# Patient Record
Sex: Male | Born: 1990 | Race: Black or African American | Hispanic: No | Marital: Single | Smoking: Never smoker
Health system: Southern US, Community
[De-identification: ages and names within clinical notes are randomized; demographics above are authoritative.]

## PROBLEM LIST (undated history)

## (undated) DIAGNOSIS — K603 Anal fistula: Secondary | ICD-10-CM

## (undated) HISTORY — PX: ARTHROSCOPIC REPAIR ACL: SUR80

---
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
## 2012-03-03 DIAGNOSIS — K603 Anal fistula: Secondary | ICD-10-CM

## 2012-03-03 HISTORY — DX: Anal fistula: K60.3

## 2012-03-10 ENCOUNTER — Ambulatory Visit (INDEPENDENT_AMBULATORY_CARE_PROVIDER_SITE_OTHER): Payer: 59 | Admitting: General Surgery

## 2012-03-10 ENCOUNTER — Encounter (INDEPENDENT_AMBULATORY_CARE_PROVIDER_SITE_OTHER): Payer: Self-pay | Admitting: General Surgery

## 2012-03-10 VITALS — BP 124/80 | HR 68 | Temp 97.2°F | Resp 16 | Ht 67.0 in | Wt 149.0 lb

## 2012-03-10 DIAGNOSIS — K603 Anal fistula: Secondary | ICD-10-CM

## 2012-03-10 NOTE — Progress Notes (Signed)
Patient ID: COBI DELPH, male   DOB: 1990/12/31, 21 y.o.   MRN: [id number]  No chief complaint on file.   HPI Brandon Hutchinson is a 21 y.o. male.  This patient is referred by Dr. Larwance Sachs for evaluation of a chronic perirectal abscess.  He has had 2 prior incision and drainage in the area of concern with the most recent time about 3 or 4 months ago. He says that he has also drained this cyst his anus twice by himself and has been treated with antibiotics as well. He says it doesn't cause any discomfort but he has daily drainage of bloody and purulent drainage. He says his bowels are otherwise normal and denies any diarrhea constipation or any family history of inflammatory bowel disease. Denies any fevers or chills. HPI  Past Medical History  Diagnosis Date  . Anorectal abscess   . Acne   . Pain in joint, lower leg   . Hemorrhage of rectum and anus     Past Surgical History  Procedure Date  . Arthroscopic repair acl 2009, 2011    Right Knee    No family history on file.  Social History History  Substance Use Topics  . Smoking status: Never Smoker   . Smokeless tobacco: Not on file  . Alcohol Use: No    Allergies no known allergies  No current outpatient prescriptions on file.    Review of Systems Review of Systems All other review of systems negative or noncontributory except as stated in the HPI  Blood pressure 124/80, pulse 68, temperature 97.2 F (36.2 C), temperature source Temporal, resp. rate 16, height 5\' 7"  (1.702 m), weight 149 lb (67.586 kg).  Physical Exam Physical Exam Physical Exam  Vitals reviewed. Constitutional: He is oriented to person, place, and time. He appears well-developed and well-nourished. No distress.  HENT:  Head: Normocephalic and atraumatic.  Mouth/Throat: No oropharyngeal exudate.  Eyes: Conjunctivae and EOM are normal. Pupils are equal, round, and reactive to light. Right eye exhibits no discharge. Left eye exhibits no discharge. No  scleral icterus.  Neck: Normal range of motion. No tracheal deviation present.  Cardiovascular: Normal rate, regular rhythm and normal heart sounds.   Pulmonary/Chest: Effort normal and breath sounds normal. No stridor. No respiratory distress. He has no wheezes. He has no rales. He exhibits no tenderness.  Abdominal: Soft. Bowel sounds are normal. He exhibits no distension and no mass. There is no tenderness. There is no rebound and no guarding.  Musculoskeletal: Normal range of motion. He exhibits no edema and no tenderness.  Neurological: He is alert and oriented to person, place, and time.  Skin: Skin is warm and dry. No rash noted. He is not diaphoretic. No erythema. No pallor.  Psychiatric: He has a normal mood and affect. His behavior is normal. Judgment and thought content normal.  Rectal: Rectal exam is negative for tenderness or internal fluctuance or drainage. There is no blood or masses. In the natal cleft he has some scarring in area of chronic drainage and an internal fistula opening and approximately 4 cm lateral to this on the buttocks he also has some granulation tissue in the area of drainage. He has some induration in the area consistent with chronic abscess. I was able to probe this external fistula openings for about 4 cm and this appeared to track towards the midline and did not appear to tract towards the anus.  Data Reviewed   Assessment    Chronic abscess with  fistula I think that this is most likely a chronic abscess in the buttock area could be from pilonidal disease as well although he does not have much disease in the midline itself. I think that she is likely developed a fistula from his prior incision and drainage sites but this fortunately does not appear to be involving the anus or the sphincter muscles. I would recommend rectal exam under anesthesia with likely fistulotomy and drainage of chronic abscess. I discussed with him the procedure and the risks including  infection, bleeding,, pain, scarring, recurrent fistula, need for chronic wound care, need for seton placement and he expressed understanding and would like to proceed with surgical intervention. However, he is getting ready to return to college and so scheduling may be an issue. We will try to set him up for rectal exam and anesthesia with possible fistulotomy as soon as possible    Plan    Plan for rectal exam under anesthesia with likely fistulotomy       Brandon Hutchinson DAVID 03/10/2012, 5:03 PM

## 2012-03-24 ENCOUNTER — Encounter (HOSPITAL_BASED_OUTPATIENT_CLINIC_OR_DEPARTMENT_OTHER): Payer: Self-pay | Admitting: *Deleted

## 2012-03-31 ENCOUNTER — Encounter (HOSPITAL_BASED_OUTPATIENT_CLINIC_OR_DEPARTMENT_OTHER): Payer: Self-pay | Admitting: *Deleted

## 2012-03-31 ENCOUNTER — Encounter (HOSPITAL_BASED_OUTPATIENT_CLINIC_OR_DEPARTMENT_OTHER): Payer: Self-pay | Admitting: Anesthesiology

## 2012-03-31 ENCOUNTER — Ambulatory Visit (HOSPITAL_BASED_OUTPATIENT_CLINIC_OR_DEPARTMENT_OTHER)
Admission: RE | Admit: 2012-03-31 | Discharge: 2012-03-31 | Disposition: A | Discharge status: Home / Self Care | Payer: 59 | Source: Ambulatory Visit | Attending: General Surgery | Admitting: General Surgery

## 2012-03-31 ENCOUNTER — Ambulatory Visit (HOSPITAL_BASED_OUTPATIENT_CLINIC_OR_DEPARTMENT_OTHER): Payer: 59 | Admitting: Anesthesiology

## 2012-03-31 ENCOUNTER — Encounter (HOSPITAL_BASED_OUTPATIENT_CLINIC_OR_DEPARTMENT_OTHER)
Admission: RE | Disposition: A | Discharge status: Home / Self Care | Payer: Self-pay | Source: Ambulatory Visit | Attending: General Surgery

## 2012-03-31 DIAGNOSIS — K603 Anal fistula, unspecified: Secondary | ICD-10-CM | POA: Insufficient documentation

## 2012-03-31 DIAGNOSIS — L0231 Cutaneous abscess of buttock: Secondary | ICD-10-CM | POA: Insufficient documentation

## 2012-03-31 DIAGNOSIS — K612 Anorectal abscess: Secondary | ICD-10-CM

## 2012-03-31 DIAGNOSIS — L03317 Cellulitis of buttock: Secondary | ICD-10-CM | POA: Insufficient documentation

## 2012-03-31 HISTORY — PX: ANAL FISTULECTOMY: SHX1139

## 2012-03-31 HISTORY — DX: Anal fistula: K60.3

## 2012-03-31 HISTORY — PX: EXAMINATION UNDER ANESTHESIA: SHX1540

## 2012-03-31 LAB — POCT HEMOGLOBIN-HEMACUE: Hemoglobin: 14.6 g/dL (ref 13.0–17.0)

## 2012-03-31 SURGERY — EXAM UNDER ANESTHESIA
Anesthesia: General | Site: Rectum | Wound class: Dirty or Infected

## 2012-03-31 MED ORDER — METOCLOPRAMIDE HCL 5 MG/ML IJ SOLN
INTRAMUSCULAR | Status: DC | PRN
  Administered 2012-03-31: 10 mg via INTRAVENOUS

## 2012-03-31 MED ORDER — ONDANSETRON HCL 4 MG/2ML IJ SOLN
INTRAMUSCULAR | Status: DC | PRN
  Administered 2012-03-31: 4 mg via INTRAVENOUS

## 2012-03-31 MED ORDER — CIPROFLOXACIN IN D5W 400 MG/200ML IV SOLN
400.0000 mg | INTRAVENOUS | Status: AC
  Administered 2012-03-31 (×2): 400 mg via INTRAVENOUS

## 2012-03-31 MED ORDER — HYDROMORPHONE HCL PF 1 MG/ML IJ SOLN: 0.2500 mg | INTRAMUSCULAR | Status: DC | PRN

## 2012-03-31 MED ORDER — LACTATED RINGERS IV SOLN
INTRAVENOUS | Status: DC
Start: 2012-03-31 — End: 2012-03-31
  Administered 2012-03-31 (×2): via INTRAVENOUS

## 2012-03-31 MED ORDER — LIDOCAINE HCL (CARDIAC) 20 MG/ML IV SOLN
INTRAVENOUS | Status: DC | PRN
  Administered 2012-03-31: 50 mg via INTRAVENOUS

## 2012-03-31 MED ORDER — PROPOFOL 10 MG/ML IV BOLUS
INTRAVENOUS | Status: DC | PRN
  Administered 2012-03-31: 200 mg via INTRAVENOUS

## 2012-03-31 MED ORDER — DEXAMETHASONE SODIUM PHOSPHATE 4 MG/ML IJ SOLN
INTRAMUSCULAR | Status: DC | PRN
  Administered 2012-03-31: 10 mg via INTRAVENOUS

## 2012-03-31 MED ORDER — OXYCODONE HCL 5 MG PO TABS: 5.0000 mg | ORAL_TABLET | Freq: Once | ORAL | Status: DC | PRN

## 2012-03-31 MED ORDER — SUCCINYLCHOLINE CHLORIDE 20 MG/ML IJ SOLN
INTRAMUSCULAR | Status: DC | PRN
  Administered 2012-03-31: 100 mg via INTRAVENOUS

## 2012-03-31 MED ORDER — FENTANYL CITRATE 0.05 MG/ML IJ SOLN
INTRAMUSCULAR | Status: DC | PRN
  Administered 2012-03-31: 25 ug via INTRAVENOUS
  Administered 2012-03-31: 100 ug via INTRAVENOUS

## 2012-03-31 MED ORDER — BUPIVACAINE LIPOSOME 1.3 % IJ SUSP
INTRAMUSCULAR | Status: DC | PRN
  Administered 2012-03-31: 20 mL

## 2012-03-31 MED ORDER — OXYCODONE HCL 5 MG/5ML PO SOLN: 5.0000 mg | Freq: Once | ORAL | Status: DC | PRN

## 2012-03-31 MED ORDER — HYDROCODONE-ACETAMINOPHEN 5-325 MG PO TABS: 1.0000 | ORAL_TABLET | ORAL | Status: AC | PRN

## 2012-03-31 MED ORDER — MIDAZOLAM HCL 5 MG/5ML IJ SOLN
INTRAMUSCULAR | Status: DC | PRN
  Administered 2012-03-31: 2 mg via INTRAVENOUS

## 2012-03-31 SURGICAL SUPPLY — 49 items
BENZOIN TINCTURE PRP APPL 2/3 (GAUZE/BANDAGES/DRESSINGS) IMPLANT
BLADE SURG 11 STRL SS (BLADE) IMPLANT
BLADE SURG 15 STRL LF DISP TIS (BLADE) ×2 IMPLANT
BLADE SURG 15 STRL SS (BLADE) ×1
BRIEF STRETCH FOR OB PAD LRG (UNDERPADS AND DIAPERS) IMPLANT
CANISTER SUCTION 1200CC (MISCELLANEOUS) ×3 IMPLANT
COVER MAYO STAND STRL (DRAPES) ×3 IMPLANT
COVER TABLE BACK 60X90 (DRAPES) ×3 IMPLANT
DECANTER SPIKE VIAL GLASS SM (MISCELLANEOUS) IMPLANT
DRAPE PED LAPAROTOMY (DRAPES) ×3 IMPLANT
DRAPE UTILITY XL STRL (DRAPES) ×3 IMPLANT
DRSG PAD ABDOMINAL 8X10 ST (GAUZE/BANDAGES/DRESSINGS) IMPLANT
ELECT COATED BLADE 2.86 ST (ELECTRODE) ×3 IMPLANT
ELECT REM PT RETURN 9FT ADLT (ELECTROSURGICAL) ×3
ELECTRODE REM PT RTRN 9FT ADLT (ELECTROSURGICAL) ×2 IMPLANT
GAUZE SPONGE 4X4 12PLY STRL LF (GAUZE/BANDAGES/DRESSINGS) ×3 IMPLANT
GAUZE VASELINE 1X8 (GAUZE/BANDAGES/DRESSINGS) IMPLANT
GLOVE BIO SURGEON STRL SZ7.5 (GLOVE) ×3 IMPLANT
GLOVE ECLIPSE 6.5 STRL STRAW (GLOVE) ×3 IMPLANT
GLOVE SKINSENSE NS SZ7.0 (GLOVE) ×1
GLOVE SKINSENSE STRL SZ7.0 (GLOVE) ×2 IMPLANT
GLOVE SURG SS PI 7.5 STRL IVOR (GLOVE) ×6 IMPLANT
GLOVE SURG SS PI 8.5 STRL IVOR (GLOVE)
GLOVE SURG SS PI 8.5 STRL STRW (GLOVE) IMPLANT
GOWN PREVENTION PLUS XLARGE (GOWN DISPOSABLE) ×6 IMPLANT
GOWN PREVENTION PLUS XXLARGE (GOWN DISPOSABLE) ×3 IMPLANT
NDL SAFETY ECLIPSE 18X1.5 (NEEDLE) ×2 IMPLANT
NEEDLE HYPO 18GX1.5 SHARP (NEEDLE) ×1
NEEDLE HYPO 22GX1.5 SAFETY (NEEDLE) ×3 IMPLANT
NEEDLE HYPO 25X1 1.5 SAFETY (NEEDLE) ×3 IMPLANT
PACK BASIN DAY SURGERY FS (CUSTOM PROCEDURE TRAY) ×3 IMPLANT
PENCIL BUTTON HOLSTER BLD 10FT (ELECTRODE) ×3 IMPLANT
SHEARS HARMONIC 9CM CVD (BLADE) IMPLANT
SLEEVE SCD COMPRESS KNEE MED (MISCELLANEOUS) ×3 IMPLANT
SPONGE GAUZE 4X4 12PLY (GAUZE/BANDAGES/DRESSINGS) ×3 IMPLANT
SPONGE SURGIFOAM ABS GEL 100 (HEMOSTASIS) IMPLANT
SURGILUBE 2OZ TUBE FLIPTOP (MISCELLANEOUS) ×3 IMPLANT
SUT CHROMIC 3 0 SH 27 (SUTURE) IMPLANT
SYR 20CC LL (SYRINGE) ×3 IMPLANT
SYR CONTROL 10ML LL (SYRINGE) ×3 IMPLANT
TAPE CLOTH 3X10 TAN LF (GAUZE/BANDAGES/DRESSINGS) ×3 IMPLANT
TAPE CLOTH SURG 4X10 WHT LF (GAUZE/BANDAGES/DRESSINGS) ×3 IMPLANT
TOWEL OR 17X24 6PK STRL BLUE (TOWEL DISPOSABLE) ×3 IMPLANT
TOWEL OR NON WOVEN STRL DISP B (DISPOSABLE) ×3 IMPLANT
TRAY DSU PREP LF (CUSTOM PROCEDURE TRAY) ×3 IMPLANT
TUBE CONNECTING 20X1/4 (TUBING) ×3 IMPLANT
UNDERPAD 30X30 INCONTINENT (UNDERPADS AND DIAPERS) ×3 IMPLANT
WATER STERILE IRR 1000ML POUR (IV SOLUTION) ×3 IMPLANT
YANKAUER SUCT BULB TIP NO VENT (SUCTIONS) ×3 IMPLANT

## 2012-03-31 NOTE — Anesthesia Postprocedure Evaluation (Signed)
Anesthesia Post Note  Patient: Brandon Hutchinson  Procedure(s) Performed: Procedure(s) (LRB): EXAM UNDER ANESTHESIA (N/A) FISTULECTOMY ANAL (N/A)  Anesthesia type: General  Patient location: PACU  Post pain: Pain level controlled  Post assessment: Patient's Cardiovascular Status Stable  Last Vitals:  Filed Vitals:   03/31/12 1645  BP: 131/83  Pulse: 71  Temp:   Resp: 17    Post vital signs: Reviewed and stable  Level of consciousness: alert  Complications: No apparent anesthesia complications

## 2012-03-31 NOTE — Op Note (Signed)
Brandon Hutchinson, [place] NO.:  [id number]  MEDICAL RECORD NO.:  [id number]  LOCATION:                                 FACILITY:  PHYSICIAN:  Lodema Pilot, MD            DATE OF BIRTH:  DATE OF PROCEDURE:  03/31/2012 DATE OF DISCHARGE:                              OPERATIVE REPORT   PROCEDURE:  Rectal exam under anesthesia with fistulectomy and drainage of buttock abscess.  PREOPERATIVE DIAGNOSIS:  Anal fistula.  POSTOPERATIVE DIAGNOSIS:  Buttock abscess and fistula.  SURGEON:  Lodema Pilot, MD  ASSISTANT:  None.  ANESTHESIA:  General endotracheal tube anesthesia with 20 mL of Exparel injected locally.  FLUIDS:  1500 mL of crystalloid.  ESTIMATED BLOOD LOSS:  Minimal.  DRAINS:  Wound packing.  SPECIMENS:  None.  COMPLICATIONS:  None apparent.  FINDINGS:  Connection from the lateral wound towards the midline opening with well-formed fistula tract as well as adjacent buttock abscess.  No evidence of internal connection to the rectum or anus.  INDICATION FOR PROCEDURE:  Mr. Antunes was in 21 year old male who has had 2 prior incision and drainage of perirectal or buttock abscesses, and he has had chronic drainage from these openings (I and D), and he has never had complete closure.  OPERATIVE DETAILS:  The patient was seen evaluated in the preoperative area, and risks and benefits of procedure were again discussed in lay terms.  Informed consent was obtained.  He was taken to the operating room, placed on table in the supine position.  General endotracheal anesthesia was obtained.  He was then flipped in the prone jack-knife position with his buttocks taped laterally.  Prophylactic antibiotics were given.  The area was prepped and draped in a standard surgical fashion and rectal exam was performed under anesthesia with the anal retractor, and I did not see any evidence of any internal connection or any internal purulence.  I cut the fistulous opening  laterally and disconnected this tract towards the midline to the other opening in the midline in his natal cleft.  The probe was passed easily from one opening to the other without creating any tract, and this was approximately 5 cm distance between the openings.  I contemplated each individual opening, cauterizing the tract and packing in the tract.  I thought that this would be much higher risk for recurrent and persistent fistula.  Decided to connect the fistulous openings over the fistula probe and after I did this, he had a well-healed obvious epithelialized fistula tract.  I excised the tract and each of the openings down to the soft tissue and healthy appearing fatty tissue.  From the inferior and lateral aspect of the wound, I saw some purulence draining from a small opening and a palpable fluctuant area just inferior to this as well.  I made a small counter incision over the fluctuant area and I encountered some purulence, and an abscess cavity.  This was a fairly small cavity, but as I probed from the cavity, this seem to connect with the other fistulous tract in the area that I saw draining purulence.  I excised this abscess  cavity and the fistulous tract as well making it all one wide, but shallow wound.  I did not see any other openings or purulent drainage, and I cauterized the base of the wound for hemostasis and irrigated the wound with sterile saline solution.  After I was confident, there was no other fistula tracts or any connection to the rectum or anus.  I injected the wound with 20 mL of Exparel anesthetic, and then packed the wound with wet-to-dry 4 x 4 gauze, and sterile dressing was applied.  All sponge, needle, and instrument counts were correct at the end of the case.  The patient tolerated the procedure well without apparent complication.          ______________________________ Lodema Pilot, MD     BL/MEDQ  D:  03/31/2012  T:  03/31/2012  Job:  [id number]

## 2012-03-31 NOTE — Interval H&P Note (Signed)
History and Physical Interval Note:  03/31/2012 3:04 PM  Brandon Hutchinson  has presented today for surgery, with the diagnosis of fistula in ano  The various methods of treatment have been discussed with the patient and family. After consideration of risks, benefits and other options for treatment, the patient has consented to  Procedure(s) (LRB): EXAM UNDER ANESTHESIA (N/A) FISTULECTOMY ANAL (N/A) as a surgical intervention .  The patient's history has been reviewed, patient examined, no change in status, stable for surgery.  I have reviewed the patient's chart and labs.  Questions were answered to the patient's satisfaction.  Patient seen in preop area.  He denies any changes from prior exam.  Will plan for rectal EUA and possible fistulotomy and seton placement.  I discussed with him again the risks of continued infection, pain, recurrence, seton placement and need for repeat operation, injury to sphincter and incontinence.    Lodema Pilot DAVID

## 2012-03-31 NOTE — H&P (View-Only) (Signed)
Patient ID: Brandon Hutchinson, male   DOB: 06/23/1991, 21 y.o.   MRN: [id number]  No chief complaint on file.   HPI Brandon Hutchinson is a 21 y.o. male.  This patient is referred by Dr. Babaoff for evaluation of a chronic perirectal abscess.  He has had 2 prior incision and drainage in the area of concern with the most recent time about 3 or 4 months ago. He says that he has also drained this cyst his anus twice by himself and has been treated with antibiotics as well. He says it doesn't cause any discomfort but he has daily drainage of bloody and purulent drainage. He says his bowels are otherwise normal and denies any diarrhea constipation or any family history of inflammatory bowel disease. Denies any fevers or chills. HPI  Past Medical History  Diagnosis Date  . Anorectal abscess   . Acne   . Pain in joint, lower leg   . Hemorrhage of rectum and anus     Past Surgical History  Procedure Date  . Arthroscopic repair acl 2009, 2011    Right Knee    No family history on file.  Social History History  Substance Use Topics  . Smoking status: Never Smoker   . Smokeless tobacco: Not on file  . Alcohol Use: No    Allergies no known allergies  No current outpatient prescriptions on file.    Review of Systems Review of Systems All other review of systems negative or noncontributory except as stated in the HPI  Blood pressure 124/80, pulse 68, temperature 97.2 F (36.2 C), temperature source Temporal, resp. rate 16, height 5' 7" (1.702 m), weight 149 lb (67.586 kg).  Physical Exam Physical Exam Physical Exam  Vitals reviewed. Constitutional: He is oriented to person, place, and time. He appears well-developed and well-nourished. No distress.  HENT:  Head: Normocephalic and atraumatic.  Mouth/Throat: No oropharyngeal exudate.  Eyes: Conjunctivae and EOM are normal. Pupils are equal, round, and reactive to light. Right eye exhibits no discharge. Left eye exhibits no discharge. No  scleral icterus.  Neck: Normal range of motion. No tracheal deviation present.  Cardiovascular: Normal rate, regular rhythm and normal heart sounds.   Pulmonary/Chest: Effort normal and breath sounds normal. No stridor. No respiratory distress. He has no wheezes. He has no rales. He exhibits no tenderness.  Abdominal: Soft. Bowel sounds are normal. He exhibits no distension and no mass. There is no tenderness. There is no rebound and no guarding.  Musculoskeletal: Normal range of motion. He exhibits no edema and no tenderness.  Neurological: He is alert and oriented to person, place, and time.  Skin: Skin is warm and dry. No rash noted. He is not diaphoretic. No erythema. No pallor.  Psychiatric: He has a normal mood and affect. His behavior is normal. Judgment and thought content normal.  Rectal: Rectal exam is negative for tenderness or internal fluctuance or drainage. There is no blood or masses. In the natal cleft he has some scarring in area of chronic drainage and an internal fistula opening and approximately 4 cm lateral to this on the buttocks he also has some granulation tissue in the area of drainage. He has some induration in the area consistent with chronic abscess. I was able to probe this external fistula openings for about 4 cm and this appeared to track towards the midline and did not appear to tract towards the anus.  Data Reviewed   Assessment    Chronic abscess with   fistula I think that this is most likely a chronic abscess in the buttock area could be from pilonidal disease as well although he does not have much disease in the midline itself. I think that she is likely developed a fistula from his prior incision and drainage sites but this fortunately does not appear to be involving the anus or the sphincter muscles. I would recommend rectal exam under anesthesia with likely fistulotomy and drainage of chronic abscess. I discussed with him the procedure and the risks including  infection, bleeding,, pain, scarring, recurrent fistula, need for chronic wound care, need for seton placement and he expressed understanding and would like to proceed with surgical intervention. However, he is getting ready to return to college and so scheduling may be an issue. We will try to set him up for rectal exam and anesthesia with possible fistulotomy as soon as possible    Plan    Plan for rectal exam under anesthesia with likely fistulotomy       Keishia Ground DAVID 03/10/2012, 5:03 PM    

## 2012-03-31 NOTE — Transfer of Care (Signed)
Immediate Anesthesia Transfer of Care Note  Patient: Brandon Hutchinson  Procedure(s) Performed: Procedure(s) (LRB): EXAM UNDER ANESTHESIA (N/A) FISTULECTOMY ANAL (N/A)  Patient Location: PACU  Anesthesia Type: General  Level of Consciousness: awake, alert  and oriented  Airway & Oxygen Therapy: Patient Spontanous Breathing and Patient connected to face mask oxygen  Post-op Assessment: Report given to PACU RN and Post -op Vital signs reviewed and stable  Post vital signs: Reviewed and stable  Complications: No apparent anesthesia complications

## 2012-03-31 NOTE — Brief Op Note (Signed)
03/31/2012  4:33 PM  PATIENT:  Brandon Hutchinson  21 y.o. male  PRE-OPERATIVE DIAGNOSIS:  fistula in ano  POST-OPERATIVE DIAGNOSIS:  fistula in ano  PROCEDURE:  Procedure(s) (LRB): EXAM UNDER ANESTHESIA (N/A) FISTULECTOMY ANAL (N/A)  SURGEON:  Surgeon(s) and Role:    * Lodema Pilot, DO - Primary  PHYSICIAN ASSISTANT:   ASSISTANTS: none   ANESTHESIA:   general  EBL:  Total I/O In: 1500 [I.V.:1500] Out: -   BLOOD ADMINISTERED:none  DRAINS: wound packing   LOCAL MEDICATIONS USED:  OTHER exparel   SPECIMEN:  No Specimen  DISPOSITION OF SPECIMEN:  N/A  COUNTS:  YES  TOURNIQUET:  * No tourniquets in log *  DICTATION: .Other Dictation: Dictation Number   PLAN OF CARE: Discharge to home after PACU  PATIENT DISPOSITION:  PACU - hemodynamically stable.   Delay start of Pharmacological VTE agent (>24hrs) due to surgical blood loss or risk of bleeding: no

## 2012-03-31 NOTE — Anesthesia Preprocedure Evaluation (Signed)
Anesthesia Evaluation  Patient identified by MRN, date of birth, ID band Patient awake    Reviewed: Allergy & Precautions, H&P , NPO status , Patient's Chart, lab work & pertinent test results  Airway Mallampati: II TM Distance: >3 FB Neck ROM: Full    Dental No notable dental hx. (+) Teeth Intact and Dental Advisory Given   Pulmonary neg pulmonary ROS,  breath sounds clear to auscultation  Pulmonary exam normal       Cardiovascular negative cardio ROS  Rhythm:Regular Rate:Normal     Neuro/Psych negative neurological ROS  negative psych ROS   GI/Hepatic negative GI ROS, Neg liver ROS,   Endo/Other  negative endocrine ROS  Renal/GU negative Renal ROS  negative genitourinary   Musculoskeletal   Abdominal   Peds  Hematology negative hematology ROS (+)   Anesthesia Other Findings   Reproductive/Obstetrics negative OB ROS                           Anesthesia Physical Anesthesia Plan  ASA: I  Anesthesia Plan: General   Post-op Pain Management:    Induction: Intravenous  Airway Management Planned: Oral ETT  Additional Equipment:   Intra-op Plan:   Post-operative Plan: Extubation in OR  Informed Consent: I have reviewed the patients History and Physical, chart, labs and discussed the procedure including the risks, benefits and alternatives for the proposed anesthesia with the patient or authorized representative who has indicated his/her understanding and acceptance.   Dental advisory given  Plan Discussed with: CRNA  Anesthesia Plan Comments:         Anesthesia Quick Evaluation  

## 2012-04-01 ENCOUNTER — Encounter (HOSPITAL_BASED_OUTPATIENT_CLINIC_OR_DEPARTMENT_OTHER): Payer: Self-pay | Admitting: General Surgery

## 2012-04-21 ENCOUNTER — Encounter (INDEPENDENT_AMBULATORY_CARE_PROVIDER_SITE_OTHER): Payer: Self-pay | Admitting: General Surgery

## 2012-04-21 ENCOUNTER — Ambulatory Visit (INDEPENDENT_AMBULATORY_CARE_PROVIDER_SITE_OTHER): Payer: 59 | Admitting: General Surgery

## 2012-04-21 VITALS — BP 112/70 | HR 68 | Temp 98.7°F | Resp 20 | Ht 67.0 in | Wt 144.6 lb

## 2012-04-21 DIAGNOSIS — Z5189 Encounter for other specified aftercare: Secondary | ICD-10-CM

## 2012-04-21 DIAGNOSIS — Z4889 Encounter for other specified surgical aftercare: Secondary | ICD-10-CM

## 2012-04-21 NOTE — Progress Notes (Signed)
Subjective:     Patient ID: Brandon Hutchinson, male   DOB: Apr 18, 1991, 21 y.o.   MRN: [id number]  HPI  F/u 3 weeks status post excision of buttock fistula and drainage of buttock abscess. He says that he is doing very well and has no pain in the area. He is surprised at how well he is healing. He has no complaints. He says that he has been doing dressing changes itself and has not been packing the wound. Review of Systems     Objective:   Physical Exam The wound is half the size as the original and is healing well. He has healthy granulation tissue with some fibrinous exudate. I repacked the wound today with a wet to dry dressing    Assessment:     Status post excision of buttock fistula-doing well He is doing well and the wound is actually healing up very nicely. I recommended that he do wet-to-dry dressing changes with possible and even twice a day if possible. He will followup with me in 3 weeks for repeat wound evaluation.    Plan:     followup in 3 weeks and continued wet-to-dry dressings.

## 2012-05-12 ENCOUNTER — Telehealth (INDEPENDENT_AMBULATORY_CARE_PROVIDER_SITE_OTHER): Payer: Self-pay

## 2012-05-12 NOTE — Telephone Encounter (Signed)
Left message regarding appointment on 05/13/12 @ 9:00 am w/Dr. Biagio Quint, appt has been r/s to 1:00 pm due to office schedule change.

## 2012-05-13 ENCOUNTER — Encounter (INDEPENDENT_AMBULATORY_CARE_PROVIDER_SITE_OTHER): Payer: 59 | Admitting: General Surgery

## 2012-05-26 ENCOUNTER — Encounter (INDEPENDENT_AMBULATORY_CARE_PROVIDER_SITE_OTHER): Payer: Self-pay | Admitting: General Surgery

## 2012-08-22 ENCOUNTER — Ambulatory Visit: Payer: 59 | Attending: Family Medicine | Admitting: Audiology

## 2012-08-22 DIAGNOSIS — Z0110 Encounter for examination of ears and hearing without abnormal findings: Secondary | ICD-10-CM | POA: Insufficient documentation

## 2012-08-22 DIAGNOSIS — F802 Mixed receptive-expressive language disorder: Secondary | ICD-10-CM | POA: Insufficient documentation

## 2012-11-01 ENCOUNTER — Emergency Department (HOSPITAL_COMMUNITY): Payer: 59

## 2012-11-01 ENCOUNTER — Emergency Department (HOSPITAL_COMMUNITY)
Admission: EM | Admit: 2012-11-01 | Discharge: 2012-11-01 | Disposition: A | Discharge status: Home / Self Care | Payer: 59 | Attending: Emergency Medicine | Admitting: Emergency Medicine

## 2012-11-01 DIAGNOSIS — W1789XA Other fall from one level to another, initial encounter: Secondary | ICD-10-CM | POA: Insufficient documentation

## 2012-11-01 DIAGNOSIS — Y9239 Other specified sports and athletic area as the place of occurrence of the external cause: Secondary | ICD-10-CM | POA: Insufficient documentation

## 2012-11-01 DIAGNOSIS — S93402A Sprain of unspecified ligament of left ankle, initial encounter: Secondary | ICD-10-CM

## 2012-11-01 DIAGNOSIS — S93409A Sprain of unspecified ligament of unspecified ankle, initial encounter: Secondary | ICD-10-CM | POA: Insufficient documentation

## 2012-11-01 DIAGNOSIS — Y9344 Activity, trampolining: Secondary | ICD-10-CM | POA: Insufficient documentation

## 2012-11-01 DIAGNOSIS — Y92838 Other recreation area as the place of occurrence of the external cause: Secondary | ICD-10-CM | POA: Insufficient documentation

## 2012-11-01 MED ORDER — IBUPROFEN 600 MG PO TABS: 600.0000 mg | ORAL_TABLET | Freq: Three times a day (TID) | ORAL | Status: DC | PRN

## 2012-11-01 MED ORDER — IBUPROFEN 200 MG PO TABS
600.0000 mg | ORAL_TABLET | Freq: Once | ORAL | Status: AC
  Administered 2012-11-01: 600 mg via ORAL
  Filled 2012-11-01: qty 3

## 2012-11-01 MED ORDER — OXYCODONE-ACETAMINOPHEN 5-325 MG PO TABS
1.0000 | ORAL_TABLET | Freq: Once | ORAL | Status: AC
  Administered 2012-11-01: 1 via ORAL
  Filled 2012-11-01: qty 1

## 2012-11-01 NOTE — ED Provider Notes (Signed)
History     CSN: [id number]  Arrival date & time 11/01/12  0054   First MD Initiated Contact with Patient 11/01/12 [phone]      Chief Complaint  Patient presents with  . Ankle Injury     HPI Patient reports doing back flip on the trampoline injuring his left ankle.  This occurred earlier today.  His pain is mild to moderate in severity.  His pain is worse with palpation and ambulation.  Nothing improves his pain.  He denies weakness numbness or tingling.  No other injury.   Past Medical History  Diagnosis Date  . Fist[place] 03/2012    Past Surgical History  Procedure Laterality Date  . Arthroscopic repair acl  2009, 2011    right knee  . Examination under anesthesia  03/31/2012    Procedure: EXAM UNDER ANESTHESIA;  Surgeon: Lodema Pilot, DO;  Location: [facility];  Service: General;  Laterality: N/A;  . Anal fistulectomy  03/31/2012    Procedure: FISTULECTOMY ANAL;  Surgeon: Lodema Pilot, DO;  Location: [facility];  Service: General;  Laterality: N/A;   fistulectomy    No family history on file.  History  Substance Use Topics  . Smoking status: Never Smoker   . Smokeless tobacco: Never Used  . Alcohol Use: No      Review of Systems  All other systems reviewed and are negative.    Allergies  Review of patient's allergies indicates no known allergies.  Home Medications   Current Outpatient Rx  Name  Route  Sig  Dispense  Refill  . ibuprofen (ADVIL,MOTRIN) 600 MG tablet   Oral   Take 1 tablet (600 mg total) by mouth every 8 (eight) hours as needed for pain.   15 tablet   0     BP 131/54  Pulse 61  Temp(Src) 99.4 F (37.4 C) (Oral)  Resp 18  Wt 155 lb (70.308 kg)  BMI 24.27 kg/m2  SpO2 100%  Physical Exam  Nursing note and vitals reviewed. Constitutional: He is oriented to person, place, and time. He appears well-developed and well-nourished.  HENT:  Head: Normocephalic and atraumatic.  Eyes: EOM are normal.  Neck:  Normal range of motion.  Cardiovascular: Normal rate, regular rhythm, normal heart sounds and intact distal pulses.   Pulmonary/Chest: Effort normal and breath sounds normal. No respiratory distress.  Abdominal: Soft. He exhibits no distension. There is no tenderness.  Musculoskeletal: Normal range of motion.  Full range of motion of left ankle.  Tenderness of his left lateral medial malleolus.  Normal DP and PT pulses his left foot.  Compartments soft.  No tenderness fifth metatarsal and the left.  Full range of motion of left knee.  Neurological: He is alert and oriented to person, place, and time.  Skin: Skin is warm and dry.  Psychiatric: He has a normal mood and affect. Judgment normal.    ED Course  Procedures (including critical care time)  Labs Reviewed - No data to display Dg Ankle Complete Left  11/01/2012  *RADIOLOGY REPORT*  Clinical Data: Lateral ankle pain after twisting injury.  LEFT ANKLE COMPLETE - 3+ VIEW  Comparison: None.  Findings: The left ankle appears intact. No evidence of acute fracture or subluxation.  No focal bone lesions.  Bone matrix and cortex appear intact.  No abnormal radiopaque densities in the soft tissues.  IMPRESSION: No acute bony abnormalities identified.   Original Report Authenticated By: Burman Nieves, M.D.    I  personally reviewed the imaging tests through PACS system I reviewed available ER/hospitalization records through the EMR   1. Ankle sprain, left, initial encounter       MDM  Likely left ankle sprain.  X-ray normal.  Discharge home with our RICE instructions.        Lyanne Co, MD 11/01/12 [phone]

## 2012-11-01 NOTE — ED Notes (Addendum)
Pt states he was at trampoline park and did a backflip and landed hard on his L ankle. States the pain is shooting up from his foot. No obvious deformity noted.

## 2016-02-28 ENCOUNTER — Encounter (HOSPITAL_COMMUNITY): Payer: Self-pay

## 2016-02-28 ENCOUNTER — Emergency Department (HOSPITAL_COMMUNITY): Payer: 59

## 2016-02-28 ENCOUNTER — Emergency Department (HOSPITAL_COMMUNITY)
Admission: EM | Admit: 2016-02-28 | Discharge: 2016-02-29 | Disposition: A | Discharge status: Home / Self Care | Payer: 59 | Attending: Emergency Medicine | Admitting: Emergency Medicine

## 2016-02-28 DIAGNOSIS — Y999 Unspecified external cause status: Secondary | ICD-10-CM | POA: Insufficient documentation

## 2016-02-28 DIAGNOSIS — R42 Dizziness and giddiness: Secondary | ICD-10-CM | POA: Diagnosis not present

## 2016-02-28 DIAGNOSIS — Y939 Activity, unspecified: Secondary | ICD-10-CM | POA: Diagnosis not present

## 2016-02-28 DIAGNOSIS — M791 Myalgia: Secondary | ICD-10-CM | POA: Diagnosis not present

## 2016-02-28 DIAGNOSIS — M7918 Myalgia, other site: Secondary | ICD-10-CM

## 2016-02-28 DIAGNOSIS — Y92410 Unspecified street and highway as the place of occurrence of the external cause: Secondary | ICD-10-CM | POA: Diagnosis not present

## 2016-02-28 DIAGNOSIS — R51 Headache: Secondary | ICD-10-CM | POA: Diagnosis present

## 2016-02-28 MED ORDER — METHOCARBAMOL 500 MG PO TABS
500.0000 mg | ORAL_TABLET | Freq: Once | ORAL | Status: AC
  Administered 2016-02-28: 500 mg via ORAL
  Filled 2016-02-28: qty 1

## 2016-02-28 MED ORDER — IBUPROFEN 800 MG PO TABS
800.0000 mg | ORAL_TABLET | Freq: Once | ORAL | Status: AC
  Administered 2016-02-28: 800 mg via ORAL
  Filled 2016-02-28: qty 1
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
## 2016-02-29 MED ORDER — METHOCARBAMOL 500 MG PO TABS: 500.0000 mg | ORAL_TABLET | Freq: Two times a day (BID) | ORAL | 0 refills | Status: DC

## 2016-02-29 MED ORDER — IBUPROFEN 800 MG PO TABS: 800.0000 mg | ORAL_TABLET | Freq: Three times a day (TID) | ORAL | 0 refills | Status: DC

## 2016-02-29 NOTE — ED Notes (Signed)
Pt ambulatory to radiology with steady gait and without assistance
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
## 2022-06-26 ENCOUNTER — Emergency Department (HOSPITAL_COMMUNITY): Admission: EM | Admit: 2022-06-26 | Discharge: 2022-06-26 | Discharge status: Against Medical Advice | Payer: 59

## 2023-01-03 ENCOUNTER — Emergency Department (HOSPITAL_COMMUNITY)
Admission: EM | Admit: 2023-01-03 | Discharge: 2023-01-04 | Disposition: A | Discharge status: Home / Self Care | Payer: Self-pay | Attending: Emergency Medicine | Admitting: Emergency Medicine

## 2023-01-03 ENCOUNTER — Encounter (HOSPITAL_COMMUNITY): Payer: Self-pay

## 2023-01-03 ENCOUNTER — Other Ambulatory Visit: Payer: Self-pay

## 2023-01-03 ENCOUNTER — Emergency Department (HOSPITAL_COMMUNITY): Payer: Self-pay

## 2023-01-03 DIAGNOSIS — Y92410 Unspecified street and highway as the place of occurrence of the external cause: Secondary | ICD-10-CM | POA: Diagnosis not present

## 2023-01-03 DIAGNOSIS — S161XXA Strain of muscle, fascia and tendon at neck level, initial encounter: Secondary | ICD-10-CM | POA: Diagnosis not present

## 2023-01-03 DIAGNOSIS — S39012A Strain of muscle, fascia and tendon of lower back, initial encounter: Secondary | ICD-10-CM | POA: Diagnosis not present

## 2023-01-03 DIAGNOSIS — S4991XA Unspecified injury of right shoulder and upper arm, initial encounter: Secondary | ICD-10-CM | POA: Insufficient documentation

## 2023-01-03 DIAGNOSIS — M542 Cervicalgia: Secondary | ICD-10-CM | POA: Diagnosis present

## 2023-01-03 MED ORDER — KETOROLAC TROMETHAMINE 30 MG/ML IJ SOLN
30.0000 mg | Freq: Once | INTRAMUSCULAR | Status: AC
  Administered 2023-01-03: 30 mg via INTRAMUSCULAR
  Filled 2023-01-03: qty 1
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
## 2023-01-04 MED ORDER — IBUPROFEN 600 MG PO TABS: 600.0000 mg | ORAL_TABLET | Freq: Four times a day (QID) | ORAL | 0 refills | Status: AC | PRN

## 2023-01-04 MED ORDER — CYCLOBENZAPRINE HCL 10 MG PO TABS: 10.0000 mg | ORAL_TABLET | Freq: Three times a day (TID) | ORAL | 0 refills | Status: AC | PRN
